# Patient Record
Sex: Female | Born: 1950 | Race: White | Hispanic: No
Health system: Southern US, Community
[De-identification: ages and names within clinical notes are randomized; demographics above are authoritative.]

---

## 2003-09-24 ENCOUNTER — Other Ambulatory Visit: Payer: Self-pay

## 2005-02-18 ENCOUNTER — Ambulatory Visit: Payer: Self-pay | Admitting: Urology

## 2005-07-06 ENCOUNTER — Ambulatory Visit: Payer: Self-pay | Admitting: Otolaryngology

## 2005-07-07 ENCOUNTER — Ambulatory Visit: Payer: Self-pay | Admitting: Urology

## 2005-07-23 ENCOUNTER — Ambulatory Visit: Payer: Self-pay | Admitting: Physician Assistant

## 2008-07-01 ENCOUNTER — Ambulatory Visit: Payer: Self-pay | Admitting: Urology

## 2010-10-02 ENCOUNTER — Observation Stay: Payer: Self-pay | Admitting: Surgery
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
## 2013-11-30 ENCOUNTER — Other Ambulatory Visit: Payer: Self-pay | Admitting: *Deleted

## 2014-01-25 ENCOUNTER — Ambulatory Visit: Payer: Self-pay | Admitting: Podiatry

## 2015-02-11 ENCOUNTER — Telehealth: Payer: Self-pay | Admitting: *Deleted

## 2015-03-10 NOTE — Telephone Encounter (Signed)
Entered in error

## 2016-10-12 ENCOUNTER — Other Ambulatory Visit: Payer: Self-pay | Admitting: *Deleted

## 2017-02-12 DEATH — deceased

## 2017-05-17 ENCOUNTER — Ambulatory Visit: Payer: Self-pay
